# Patient Record
Sex: Female | Born: 1980 | Race: Black or African American | Hispanic: No | Marital: Single | Smoking: Never smoker
Health system: Southern US, Community
[De-identification: ages and names within clinical notes are randomized; demographics above are authoritative.]

## PROBLEM LIST (undated history)

## (undated) DIAGNOSIS — F419 Anxiety disorder, unspecified: Secondary | ICD-10-CM

## (undated) DIAGNOSIS — G43909 Migraine, unspecified, not intractable, without status migrainosus: Secondary | ICD-10-CM

---

## 2003-01-03 ENCOUNTER — Other Ambulatory Visit: Admission: RE | Admit: 2003-01-03 | Discharge: 2003-01-03 | Payer: Self-pay | Admitting: Obstetrics & Gynecology

## 2003-08-07 ENCOUNTER — Emergency Department (HOSPITAL_COMMUNITY): Admission: EM | Admit: 2003-08-07 | Discharge: 2003-08-08 | Payer: Self-pay

## 2003-12-12 ENCOUNTER — Emergency Department (HOSPITAL_COMMUNITY): Admission: EM | Admit: 2003-12-12 | Discharge: 2003-12-12 | Payer: Self-pay | Admitting: Emergency Medicine

## 2005-03-08 IMAGING — CR DG CHEST 2V
2 series · 2 of 2 positions shown · non-contrast
Comparison: none

CLINICAL DATA: Flu, cough. 
 CHEST, TWO VIEWS  
 The heart size and mediastinal contours are normal.  The lungs are clear.  The visualized skeleton is unremarkable.
 IMPRESSION
 No active disease.

[view not recorded (1 of 2)]
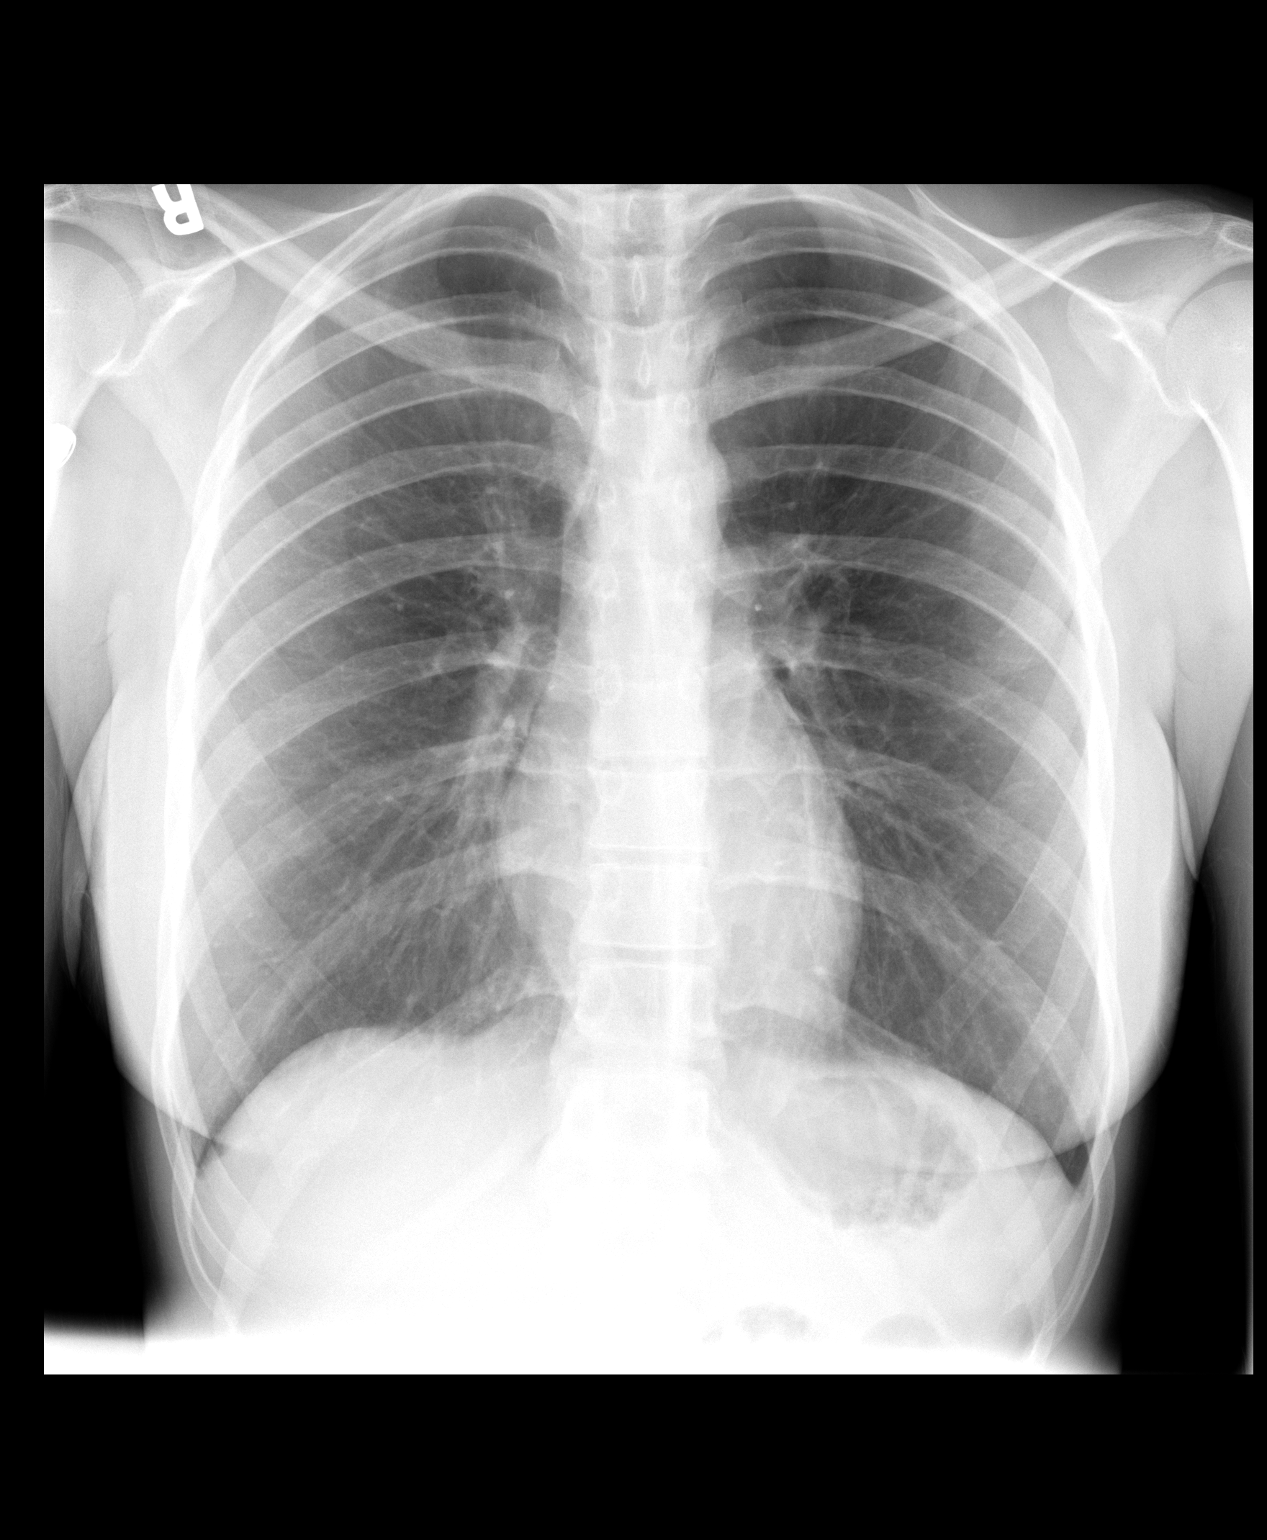

[view not recorded (2 of 2)]
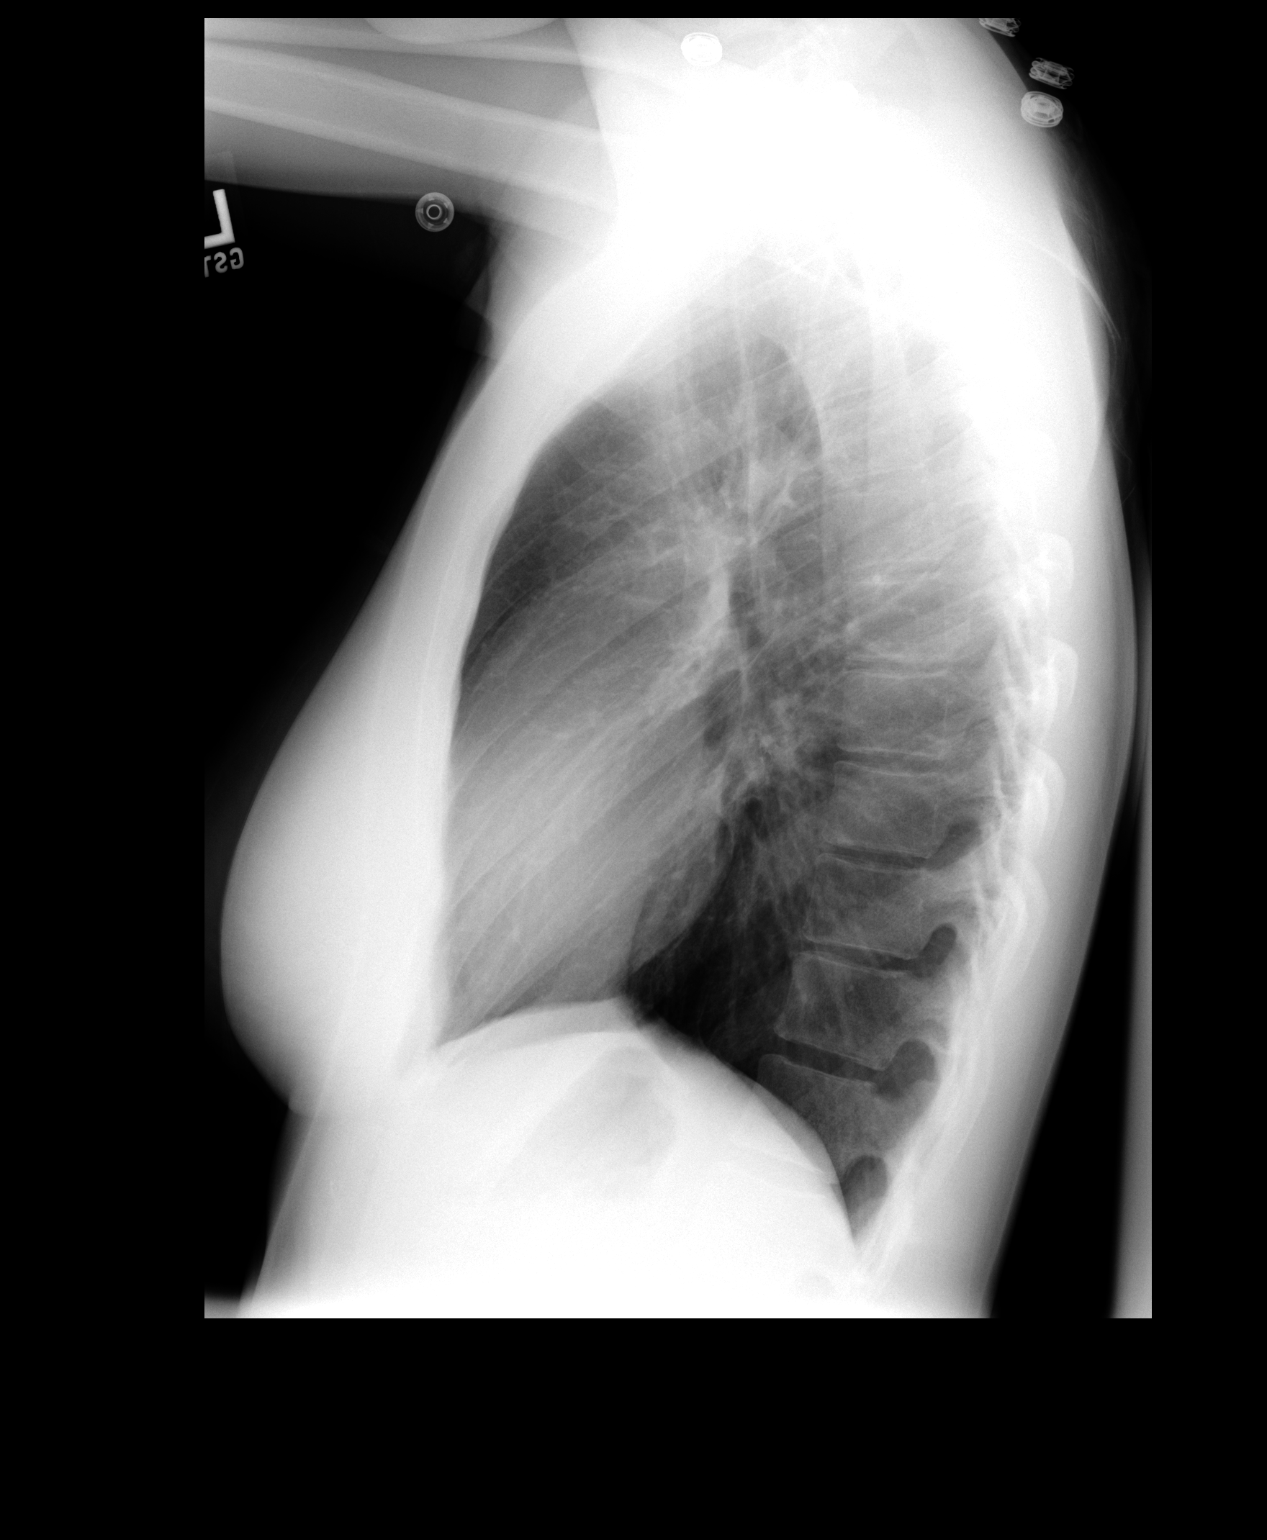

[2 of 2 positions shown; findings below may reference images not displayed]
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
## 2010-07-07 ENCOUNTER — Encounter: Payer: Self-pay | Admitting: Gastroenterology

## 2019-10-10 ENCOUNTER — Ambulatory Visit: Payer: 59 | Admitting: Nurse Practitioner

## 2021-07-25 ENCOUNTER — Encounter: Payer: Self-pay | Admitting: Emergency Medicine

## 2021-07-25 ENCOUNTER — Emergency Department
Admission: EM | Admit: 2021-07-25 | Discharge: 2021-07-25 | Disposition: A | Discharge status: Home / Self Care | Payer: Commercial Managed Care - HMO | Attending: Emergency Medicine | Admitting: Emergency Medicine

## 2021-07-25 ENCOUNTER — Other Ambulatory Visit: Payer: Self-pay

## 2021-07-25 ENCOUNTER — Emergency Department: Payer: Commercial Managed Care - HMO

## 2021-07-25 DIAGNOSIS — G43909 Migraine, unspecified, not intractable, without status migrainosus: Secondary | ICD-10-CM | POA: Diagnosis not present

## 2021-07-25 HISTORY — DX: Anxiety disorder, unspecified: F41.9

## 2021-07-25 HISTORY — DX: Migraine, unspecified, not intractable, without status migrainosus: G43.909

## 2021-07-25 MED ORDER — METOCLOPRAMIDE HCL 10 MG PO TABS: 10.0000 mg | ORAL_TABLET | Freq: Three times a day (TID) | ORAL | 0 refills | Status: AC

## 2021-07-25 MED ORDER — DIPHENHYDRAMINE HCL 25 MG PO CAPS
25.0000 mg | ORAL_CAPSULE | Freq: Once | ORAL | Status: AC
  Administered 2021-07-25: 25 mg via ORAL
  Filled 2021-07-25: qty 1

## 2021-07-25 MED ORDER — METOCLOPRAMIDE HCL 10 MG PO TABS
10.0000 mg | ORAL_TABLET | Freq: Once | ORAL | Status: AC
  Administered 2021-07-25: 10 mg via ORAL
  Filled 2021-07-25: qty 1

## 2021-07-25 MED ORDER — KETOROLAC TROMETHAMINE 30 MG/ML IJ SOLN
30.0000 mg | Freq: Once | INTRAMUSCULAR | Status: AC
  Administered 2021-07-25: 30 mg via INTRAMUSCULAR
  Filled 2021-07-25: qty 1

## 2021-07-25 MED ORDER — SUMATRIPTAN SUCCINATE 50 MG PO TABS: 50.0000 mg | ORAL_TABLET | Freq: Once | ORAL | 0 refills | Status: AC | PRN

## 2021-07-25 NOTE — ED Notes (Signed)
See triage note  presents with h/a  states h/a started couple of days ago  positive photosensitive  hx of migraine

## 2021-07-25 NOTE — ED Triage Notes (Signed)
Pt via POV from home. Pt c/o headache and light/photosentivity. Pt has a hx of migraines. Pt states its been going on for 2 days. Pt is A&Ox4 and NAD.

## 2021-07-25 NOTE — ED Provider Notes (Signed)
[facility] Emergency Department Provider Note     Event Date/Time   First MD Initiated Contact with Patient 07/25/21 1548     (approximate)   History   Migraine   HPI  Deanna Palmer is a 41 y.o. female with a history of migraines, presents to the ED for evaluation of a 2-day headache.  Patient reports the headache pain is consistent with her migraine pattern, although she has not had migraines since she was in middle school.  She denies any recent injury, trauma, falls patient denies any recent illness or infection.  She notes light sensitivity and sound sensitivity, but denies nausea/vomiting.  She denies any medication at home for symptom relief.  Patient is without visual disturbance, vertigo, tinnitus, paralysis, or weakness.  Physical Exam   Triage Vital Signs: ED Triage Vitals  Enc Vitals Group     BP 07/25/21 1541 104/81     Pulse Rate 07/25/21 1541 83     Resp 07/25/21 1541 18     Temp 07/25/21 1541 98.5 F (36.9 C)     Temp Source 07/25/21 1541 Oral     SpO2 07/25/21 1541 98 %     Weight 07/25/21 1542 128 lb (58.1 kg)     Height 07/25/21 1542 5' 6.5" (1.689 m)     Head Circumference --      Peak Flow --      Pain Score 07/25/21 1542 9     Pain Loc --      Pain Edu? --      Excl. in GC? --     Most recent vital signs: Vitals:   07/25/21 1541  BP: 104/81  Pulse: 83  Resp: 18  Temp: 98.5 F (36.9 C)  SpO2: 98%    General Awake, no distress.  Head:  No acute traumatic findings Eyes:   PERRL. EOMI. CV:  Good peripheral perfusion.  RESP:  Normal effort.  ABD:  No distention.  Neuro:  CN II to XII grossly intact.   ED Results / Procedures / Treatments   Labs (all labs ordered are listed, but only abnormal results are displayed) Labs Reviewed - No data to display   EKG   RADIOLOGY  CT HEAD WO CONTRAST (5MM)  Result Date: 07/25/2021 CLINICAL DATA:  Headache EXAM: CT HEAD WITHOUT CONTRAST TECHNIQUE: Contiguous  axial images were obtained from the base of the skull through the vertex without intravenous contrast. RADIATION DOSE REDUCTION: This exam was performed according to the departmental dose-optimization program which includes automated exposure control, adjustment of the mA and/or kV according to patient size and/or use of iterative reconstruction technique. COMPARISON:  CT 12/12/2003 FINDINGS: Brain: No evidence of acute infarction, hemorrhage, hydrocephalus, extra-axial collection or mass lesion/mass effect. Vascular: No hyperdense vessel or unexpected calcification. Skull: Normal. Negative for fracture or focal lesion. Sinuses/Orbits: No acute finding. Other: None IMPRESSION: Negative non contrasted CT appearance of the brain Electronically Signed   By: Jasmine PangKim  Fujinaga M.D.   On: 07/25/2021 18:03     PROCEDURES:  Critical Care performed: No  Procedures   MEDICATIONS ORDERED IN ED: Medications  ketorolac (TORADOL) 30 MG/ML injection 30 mg (30 mg Intramuscular Given 07/25/21 1629)  diphenhydrAMINE (BENADRYL) capsule 25 mg (25 mg Oral Given 07/25/21 1629)  metoCLOPramide (REGLAN) tablet 10 mg (10 mg Oral Given 07/25/21 1629)     IMPRESSION / MDM / ASSESSMENT AND PLAN / ED COURSE  I reviewed the triage vital signs and the nursing  notes.                              Differential diagnosis includes, but is not limited to, intracranial hemorrhage, meningitis/encephalitis, previous head trauma, cavernous venous thrombosis, tension headache, temporal arteritis, migraine or migraine equivalent, idiopathic intracranial hypertension, and non-specific headache.   Patient with ED evaluation of symptoms consistent with her history of migraines patient presents in no acute distress with some left-sided frontal headache pain with pain focal to the left eye.  She also reports some pain and pressure to the top of the head that intensifies with head movement.  Patient reports improvement of her symptomology after ED  medication ministration of a migraine cocktail.  CT imaging of the head, does not show any acute intracranial process based on the review of images.  Patient's diagnosis is consistent with migraine. Patient will be discharged home with prescriptions for Imitrex and Reglan. Patient is to follow up with her primary provider as needed or otherwise directed. Patient is given ED precautions to return to the ED for any worsening or new symptoms.    FINAL CLINICAL IMPRESSION(S) / ED DIAGNOSES   Final diagnoses:  Migraine without status migrainosus, not intractable, unspecified migraine type     Rx / DC Orders   ED Discharge Orders          Ordered    metoCLOPramide (REGLAN) 10 MG tablet  3 times daily with meals        07/25/21 1845    SUMAtriptan (IMITREX) 50 MG tablet  Once PRN        07/25/21 1845             Note:  This document was prepared using Dragon voice recognition software and may include unintentional dictation errors.    Melvenia Needles, PA-C 07/25/21 2247    Vanessa Ivanhoe, MD 07/26/21 [phone]

## 2021-07-25 NOTE — Discharge Instructions (Signed)
Take the prescription medications as prescribed.  Take OTC Benadryl, Tylenol, and Motrin as discussed for headache pain management.  Follow-up with local urgent care or return to the ED for worsening symptoms.

## 2022-10-21 ENCOUNTER — Telehealth: Payer: Self-pay

## 2022-10-21 NOTE — Telephone Encounter (Signed)
LVM for patient to call back. AS, CMA 

## 2023-05-17 ENCOUNTER — Encounter: Payer: Self-pay | Admitting: Emergency Medicine

## 2023-05-17 ENCOUNTER — Emergency Department
Admission: EM | Admit: 2023-05-17 | Discharge: 2023-05-17 | Disposition: A | Discharge status: Home / Self Care | Payer: Commercial Managed Care - PPO | Attending: Emergency Medicine | Admitting: Emergency Medicine

## 2023-05-17 DIAGNOSIS — F1220 Cannabis dependence, uncomplicated: Secondary | ICD-10-CM | POA: Diagnosis present

## 2023-05-17 DIAGNOSIS — F12221 Cannabis dependence with intoxication delirium: Secondary | ICD-10-CM | POA: Insufficient documentation

## 2023-05-17 DIAGNOSIS — F19921 Other psychoactive substance use, unspecified with intoxication with delirium: Secondary | ICD-10-CM

## 2023-05-17 LAB — COMPREHENSIVE METABOLIC PANEL
ALT: 10 U/L (ref 0–44)
AST: 21 U/L (ref 15–41)
Albumin: 4.1 g/dL (ref 3.5–5.0)
Alkaline Phosphatase: 91 U/L (ref 38–126)
Anion gap: 9 (ref 5–15)
BUN: 7 mg/dL (ref 6–20)
CO2: 22 mmol/L (ref 22–32)
Calcium: 8.5 mg/dL — ABNORMAL LOW (ref 8.9–10.3)
Chloride: 104 mmol/L (ref 98–111)
Creatinine, Ser: 0.75 mg/dL (ref 0.44–1.00)
GFR, Estimated: >60 mL/min (ref >60)
Glucose, Bld: 192 mg/dL — ABNORMAL HIGH (ref 70–99)
Potassium: 3.1 mmol/L — ABNORMAL LOW (ref 3.5–5.1)
Sodium: 135 mmol/L (ref 135–145)
Total Bilirubin: 0.6 mg/dL (ref <1.2)
Total Protein: 7.5 g/dL (ref 6.5–8.1)

## 2023-05-17 LAB — CBC WITH DIFFERENTIAL/PLATELET
Abs Immature Granulocytes: 0.01 10*3/uL (ref 0.00–0.07)
Basophils Absolute: 0.1 10*3/uL (ref 0.0–0.1)
Basophils Relative: 1 %
Eosinophils Absolute: 0.1 10*3/uL (ref 0.0–0.5)
Eosinophils Relative: 1 %
HCT: 36 % (ref 36.0–46.0)
Hemoglobin: 12 g/dL (ref 12.0–15.0)
Immature Granulocytes: 0 %
Lymphocytes Relative: 49 %
Lymphs Abs: 3.7 10*3/uL (ref 0.7–4.0)
MCH: 28.8 pg (ref 26.0–34.0)
MCHC: 33.3 g/dL (ref 30.0–36.0)
MCV: 86.5 fL (ref 80.0–100.0)
Monocytes Absolute: 0.6 10*3/uL (ref 0.1–1.0)
Monocytes Relative: 8 %
Neutro Abs: 3.1 10*3/uL (ref 1.7–7.7)
Neutrophils Relative %: 41 %
Platelets: 277 10*3/uL (ref 150–400)
RBC: 4.16 MIL/uL (ref 3.87–5.11)
RDW: 13.2 % (ref 11.5–15.5)
WBC: 7.6 10*3/uL (ref 4.0–10.5)
nRBC: 0 % (ref 0.0–0.2)

## 2023-05-17 LAB — SALICYLATE LEVEL: Salicylate Lvl: <7 mg/dL — ABNORMAL LOW (ref 7.0–30.0)

## 2023-05-17 LAB — ACETAMINOPHEN LEVEL: Acetaminophen (Tylenol), Serum: <10 ug/mL — ABNORMAL LOW (ref 10–30)

## 2023-05-17 LAB — ETHANOL: Alcohol, Ethyl (B): <10 mg/dL (ref <10)

## 2023-05-17 MED ORDER — ONDANSETRON 4 MG PO TBDP: 4.0000 mg | ORAL_TABLET | Freq: Three times a day (TID) | ORAL | 0 refills | Status: AC | PRN

## 2023-05-17 NOTE — ED Triage Notes (Signed)
Pt here from home with c/o feeling shaking and anxious after drinking a  slushy with thc in it

## 2023-05-17 NOTE — ED Provider Notes (Signed)
[facility] Provider Note    Event Date/Time   First MD Initiated Contact with Patient 05/17/23 2046     (approximate)   History   Chief Complaint: Drug Overdose   HPI  Deanna Palmer is a 42 y.o. female with a past history of anxiety on Lexapro who comes to the ED due to confusion after drinking a THC slushy that she acquired at a local vape shop.,  Reports that when she came home, her daughter reported what it happened, stated that she was having a hard time telling what was and was not reality, felt shaky.  Denies any other ingestions, no alcohol or other drugs.  No recent illness.  Mom notes that patient had reported that the Lexapro was not working well enough for her.  She does have a primary care doctor.  No suspected intentional self-harm.          Physical Exam   Triage Vital Signs: ED Triage Vitals  Encounter Vitals Group     BP 05/17/23 1926 132/69     Systolic BP Percentile --      Diastolic BP Percentile --      Pulse Rate 05/17/23 1926 (!) 147     Resp 05/17/23 1926 20     Temp 05/17/23 1926 (!) 97.5 F (36.4 C)     Temp src --      SpO2 05/17/23 1926 100 %     Weight --      Height --      Head Circumference --      Peak Flow --      Pain Score 05/17/23 1924 0     Pain Loc --      Pain Education --      Exclude from Growth Chart --     Most recent vital signs: Vitals:   05/17/23 2200 05/17/23 2300  BP: 99/66 93/62  Pulse: 69 66  Resp: 18 14  Temp:    SpO2: 100% 100%    General: Awake, no distress.  Intoxicated CV:  Good peripheral perfusion.  Regular rate rhythm, heart rate 90.  Normal distal pulses.  No murmurs Resp:  Normal effort.  Clear to auscultation bilaterally Abd:  No distention.  Soft nontender Other:  To follow commands.  Has multidirectional nystagmus including bilateral and vertical.  Moist oral mucosa.  No lower extremity edema   ED Results / Procedures / Treatments   Labs (all labs ordered  are listed, but only abnormal results are displayed) Labs Reviewed  COMPREHENSIVE METABOLIC PANEL - Abnormal; Notable for the following components:      Result Value   Potassium 3.1 (*)    Glucose, Bld 192 (*)    Calcium 8.5 (*)    All other components within normal limits  ACETAMINOPHEN LEVEL - Abnormal; Notable for the following components:   Acetaminophen (Tylenol), Serum <10 (*)    All other components within normal limits  SALICYLATE LEVEL - Abnormal; Notable for the following components:   Salicylate Lvl <7.0 (*)    All other components within normal limits  CBC WITH DIFFERENTIAL/PLATELET  ETHANOL  URINE DRUG SCREEN, QUALITATIVE ([facility] ONLY)  POC URINE PREG, ED     EKG Interpreted by me Sinus tachycardia rate 149.  Normal axis, mildly prolonged QTc of 526 ms.  Poor R wave progression.  Normal ST segments and T waves.   RADIOLOGY    PROCEDURES:  Procedures   MEDICATIONS ORDERED IN ED: Medications -  No data to display   IMPRESSION / MDM / ASSESSMENT AND PLAN / ED COURSE  I reviewed the triage vital signs and the nursing notes.  DDx: THC intoxication, polysubstance abuse, electrolyte abnormality, AKI  Patient's presentation is most consistent with acute presentation with potential threat to life or bodily function.  Patient presents with accidental THC overdose which appears to be due to attempted self-medication of her anxiety symptoms.  No signs of trauma, no historical suspicion of coingestion.  Vital signs are normal on my assessment, exam is otherwise benign.  Will monitor for improvement in the ED.  Mother is at bedside with patient.   Clinical Course as of 05/17/23 2322  Wynelle Link May 17, 2023  2303 Patient is awake, interactive.  Denies pain nausea or other acute complaints.  Will p.o. trial. [PS]    Clinical Course User Index [PS] Sharman Cheek, MD    ----------------------------------------- 11:22 PM on  05/17/2023 ----------------------------------------- Toleratring PO. Stable for DC.    FINAL CLINICAL IMPRESSION(S) / ED DIAGNOSES   Final diagnoses:  Drug intoxication with delirium (HCC)     Rx / DC Orders   ED Discharge Orders          Ordered    ondansetron (ZOFRAN-ODT) 4 MG disintegrating tablet  Every 8 hours PRN        05/17/23 2320             Note:  This document was prepared using Dragon voice recognition software and may include unintentional dictation errors.   Sharman Cheek, MD 05/17/23 2322

## 2023-05-17 NOTE — Discharge Instructions (Addendum)
Get plenty of rest, stay well-hydrated, and eat a simple diet over the next 24 hours while the THC effect wears off.  You can take zofran as needed for nausea.

## 2023-05-17 NOTE — ED Notes (Signed)
This RN to bedside to introduce self to pt and family. Pt is sleeping. Family provided a recliner and blanket.

## 2023-08-05 ENCOUNTER — Other Ambulatory Visit: Payer: Self-pay

## 2023-08-05 ENCOUNTER — Encounter: Payer: Self-pay | Admitting: Emergency Medicine

## 2023-08-05 ENCOUNTER — Emergency Department: Payer: Commercial Managed Care - PPO

## 2023-08-05 ENCOUNTER — Emergency Department
Admission: EM | Admit: 2023-08-05 | Discharge: 2023-08-05 | Disposition: A | Discharge status: Home / Self Care | Payer: Commercial Managed Care - PPO | Attending: Emergency Medicine | Admitting: Emergency Medicine

## 2023-08-05 DIAGNOSIS — S82832A Other fracture of upper and lower end of left fibula, initial encounter for closed fracture: Secondary | ICD-10-CM | POA: Diagnosis not present

## 2023-08-05 DIAGNOSIS — S99912A Unspecified injury of left ankle, initial encounter: Secondary | ICD-10-CM | POA: Diagnosis present

## 2023-08-05 DIAGNOSIS — Y92009 Unspecified place in unspecified non-institutional (private) residence as the place of occurrence of the external cause: Secondary | ICD-10-CM | POA: Insufficient documentation

## 2023-08-05 DIAGNOSIS — M25521 Pain in right elbow: Secondary | ICD-10-CM | POA: Insufficient documentation

## 2023-08-05 DIAGNOSIS — W19XXXA Unspecified fall, initial encounter: Secondary | ICD-10-CM | POA: Diagnosis not present

## 2023-08-05 DIAGNOSIS — S00212A Abrasion of left eyelid and periocular area, initial encounter: Secondary | ICD-10-CM | POA: Insufficient documentation

## 2023-08-05 DIAGNOSIS — R55 Syncope and collapse: Secondary | ICD-10-CM | POA: Diagnosis not present

## 2023-08-05 LAB — COMPREHENSIVE METABOLIC PANEL
ALT: 14 U/L (ref 0–44)
AST: 22 U/L (ref 15–41)
Albumin: 4.2 g/dL (ref 3.5–5.0)
Alkaline Phosphatase: 86 U/L (ref 38–126)
Anion gap: 13 (ref 5–15)
BUN: 16 mg/dL (ref 6–20)
CO2: 21 mmol/L — ABNORMAL LOW (ref 22–32)
Calcium: 9 mg/dL (ref 8.9–10.3)
Chloride: 105 mmol/L (ref 98–111)
Creatinine, Ser: 0.65 mg/dL (ref 0.44–1.00)
GFR, Estimated: >60 mL/min (ref >60)
Glucose, Bld: 89 mg/dL (ref 70–99)
Potassium: 4.1 mmol/L (ref 3.5–5.1)
Sodium: 139 mmol/L (ref 135–145)
Total Bilirubin: 0.5 mg/dL (ref 0.0–1.2)
Total Protein: 7.7 g/dL (ref 6.5–8.1)

## 2023-08-05 LAB — CBC WITH DIFFERENTIAL/PLATELET
Abs Immature Granulocytes: 0.04 10*3/uL (ref 0.00–0.07)
Basophils Absolute: 0 10*3/uL (ref 0.0–0.1)
Basophils Relative: 0 %
Eosinophils Absolute: 0 10*3/uL (ref 0.0–0.5)
Eosinophils Relative: 0 %
HCT: 33.3 % — ABNORMAL LOW (ref 36.0–46.0)
Hemoglobin: 11.4 g/dL — ABNORMAL LOW (ref 12.0–15.0)
Immature Granulocytes: 1 %
Lymphocytes Relative: 9 %
Lymphs Abs: 0.8 10*3/uL (ref 0.7–4.0)
MCH: 28.8 pg (ref 26.0–34.0)
MCHC: 34.2 g/dL (ref 30.0–36.0)
MCV: 84.1 fL (ref 80.0–100.0)
Monocytes Absolute: 0.3 10*3/uL (ref 0.1–1.0)
Monocytes Relative: 4 %
Neutro Abs: 7.7 10*3/uL (ref 1.7–7.7)
Neutrophils Relative %: 86 %
Platelets: 240 10*3/uL (ref 150–400)
RBC: 3.96 MIL/uL (ref 3.87–5.11)
RDW: 13.4 % (ref 11.5–15.5)
WBC: 8.8 10*3/uL (ref 4.0–10.5)
nRBC: 0 % (ref 0.0–0.2)

## 2023-08-05 LAB — RESP PANEL BY RT-PCR (RSV, FLU A&B, COVID)  RVPGX2
Influenza A by PCR: NEGATIVE
Influenza B by PCR: NEGATIVE
Resp Syncytial Virus by PCR: NEGATIVE
SARS Coronavirus 2 by RT PCR: NEGATIVE

## 2023-08-05 LAB — TROPONIN I (HIGH SENSITIVITY): Troponin I (High Sensitivity): 3 ng/L (ref <18)

## 2023-08-05 MED ORDER — HYDROCODONE-ACETAMINOPHEN 5-325 MG PO TABS: 1.0000 | ORAL_TABLET | Freq: Four times a day (QID) | ORAL | 0 refills | Status: AC | PRN

## 2023-08-05 MED ORDER — ONDANSETRON HCL 4 MG/2ML IJ SOLN
4.0000 mg | Freq: Once | INTRAMUSCULAR | Status: AC
  Administered 2023-08-05: 4 mg via INTRAVENOUS
  Filled 2023-08-05: qty 2

## 2023-08-05 MED ORDER — SODIUM CHLORIDE 0.9 % IV BOLUS
500.0000 mL | Freq: Once | INTRAVENOUS | Status: AC
Start: 2023-08-05 — End: 2023-08-05
  Administered 2023-08-05: 500 mL via INTRAVENOUS

## 2023-08-05 MED ORDER — MORPHINE SULFATE (PF) 4 MG/ML IV SOLN
4.0000 mg | Freq: Once | INTRAVENOUS | Status: AC
  Administered 2023-08-05: 4 mg via INTRAVENOUS
  Filled 2023-08-05: qty 1

## 2023-08-05 NOTE — Discharge Instructions (Addendum)
Follow-up with orthopedics or podiatry.  Please call for an appointment.  Try and keep the splint as dry as possible.

## 2023-08-05 NOTE — ED Triage Notes (Signed)
Pt via POV c/o left ankle pain after she fell at home today while trying to get to the restroom. Pt says she also passed out because she overheated. Pt has small abrasion to left outer eyelid and right elbow. Left ankle pain rated 10/10 and pt is unable to tolerate bearing weight.

## 2023-08-05 NOTE — ED Provider Notes (Signed)
[facility] Provider Note    Event Date/Time   First MD Initiated Contact with Patient 08/05/23 1359     (approximate)   History   Ankle Pain   HPI  Deanna Palmer is a 43 y.o. female with history of migraines and anxiety presents emergency department after 2 syncopal episodes today.  Patient is complaining of abrasion on the left eyelid, right elbow, and the left ankle is painful to bear weight.  Patient states she felt like she was getting overheated and passed out.  No chest pain or shortness of breath.  This is not happened in this capacity before.  No vomiting, diarrhea      Physical Exam   Triage Vital Signs: ED Triage Vitals  Encounter Vitals Group     BP 08/05/23 1342 99/71     Systolic BP Percentile --      Diastolic BP Percentile --      Pulse Rate 08/05/23 1342 96     Resp 08/05/23 1342 16     Temp 08/05/23 1342 98.3 F (36.8 C)     Temp Source 08/05/23 1342 Oral     SpO2 08/05/23 1342 96 %     Weight 08/05/23 1344 128 lb (58.1 kg)     Height 08/05/23 1344 5' 5.5" (1.664 m)     Head Circumference --      Peak Flow --      Pain Score 08/05/23 1342 10     Pain Loc --      Pain Education --      Exclude from Growth Chart --     Most recent vital signs: Vitals:   08/05/23 1518 08/05/23 1600  BP: 99/74 92/61  Pulse: 77 61  Resp:    Temp:    SpO2: 100% 100%     General: Awake, no distress.   CV:  Good peripheral perfusion. regular rate and  rhythm Resp:  Normal effort. Lungs cta Abd:  No distention.   Other:      ED Results / Procedures / Treatments   Labs (all labs ordered are listed, but only abnormal results are displayed) Labs Reviewed  COMPREHENSIVE METABOLIC PANEL - Abnormal; Notable for the following components:      Result Value   CO2 21 (*)    All other components within normal limits  CBC WITH DIFFERENTIAL/PLATELET - Abnormal; Notable for the following components:   Hemoglobin 11.4 (*)    HCT 33.3  (*)    All other components within normal limits  RESP PANEL BY RT-PCR (RSV, FLU A&B, COVID)  [id number]  URINALYSIS, ROUTINE W REFLEX MICROSCOPIC  POC URINE PREG, ED  TROPONIN I (HIGH SENSITIVITY)  TROPONIN I (HIGH SENSITIVITY)     EKG     RADIOLOGY CT of the head    PROCEDURES:   Procedures Chief Complaint  Patient presents with   Ankle Pain      MEDICATIONS ORDERED IN ED: Medications  sodium chloride 0.9 % bolus 500 mL (0 mLs Intravenous Stopped 08/05/23 1625)  ondansetron (ZOFRAN) injection 4 mg (4 mg Intravenous Given 08/05/23 1513)  morphine (PF) 4 MG/ML injection 4 mg (4 mg Intravenous Given 08/05/23 1513)     IMPRESSION / MDM / ASSESSMENT AND PLAN / ED COURSE  I reviewed the triage vital signs and the nursing notes.  Differential diagnosis includes, but is not limited to, syncope, CVA, SAH, arrhythmia, contusion, fracture,   Patient's presentation is most consistent with acute illness / injury with system symptoms.   X-ray of the left ankle independently reviewed and interpreted by me as having a nondisplaced oblique fracture of the distal fibula.,  Confirmed by radiology  Due to the 2 syncopal episodes today I feel that we should do more of a workup and just x-ray of her ankle.  Syncope protocols initiated   CT of the head independently reviewed interpreted by me as being negative for acute abnormality  EKG, normal sinus rhythm, some right atrial enlargement, see physician read  Labs are reassuring  I did explain these findings to the patient.  She assured me there is no chance that she is pregnant so we will cancel the pregnancy test.  She is to follow-up with her regular doctor.  Return emergency department if worsening.  See orthopedics for the ankle fracture.  She is in agreement treatment plan.  Pain medication prescribed.  Discharged in stable condition.    FINAL CLINICAL IMPRESSION(S) / ED DIAGNOSES   Final  diagnoses:  Other closed fracture of distal end of left fibula, initial encounter  Syncope, unspecified syncope type     Rx / DC Orders   ED Discharge Orders          Ordered    HYDROcodone-acetaminophen (NORCO/VICODIN) 5-325 MG tablet  Every 6 hours PRN        08/05/23 1631             Note:  This document was prepared using Dragon voice recognition software and may include unintentional dictation errors.    Faythe Ghee, PA-C 08/05/23 1632    Minna Antis, MD 08/06/23 2053

## 2023-08-10 DIAGNOSIS — S8265XA Nondisplaced fracture of lateral malleolus of left fibula, initial encounter for closed fracture: Secondary | ICD-10-CM | POA: Diagnosis not present

## 2023-08-18 DIAGNOSIS — S8265XA Nondisplaced fracture of lateral malleolus of left fibula, initial encounter for closed fracture: Secondary | ICD-10-CM | POA: Diagnosis not present

## 2023-09-15 DIAGNOSIS — S8265XA Nondisplaced fracture of lateral malleolus of left fibula, initial encounter for closed fracture: Secondary | ICD-10-CM | POA: Diagnosis not present

## 2023-10-27 DIAGNOSIS — S8265XA Nondisplaced fracture of lateral malleolus of left fibula, initial encounter for closed fracture: Secondary | ICD-10-CM | POA: Diagnosis not present
